# Patient Record
Sex: Male | Born: 1985 | Race: Black or African American | Hispanic: No | Marital: Single | Smoking: Current every day smoker
Health system: Southern US, Community
[De-identification: ages and names within clinical notes are randomized; demographics above are authoritative.]

## PROBLEM LIST (undated history)

## (undated) HISTORY — PX: OTHER SURGICAL HISTORY: SHX169

---

## 2008-11-26 ENCOUNTER — Emergency Department (HOSPITAL_COMMUNITY): Admission: EM | Admit: 2008-11-26 | Discharge: 2008-11-26 | Payer: Self-pay | Admitting: Emergency Medicine
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
## 2014-05-05 ENCOUNTER — Encounter (HOSPITAL_COMMUNITY): Payer: Self-pay | Admitting: Emergency Medicine

## 2014-05-05 ENCOUNTER — Emergency Department (HOSPITAL_COMMUNITY)
Admission: EM | Admit: 2014-05-05 | Discharge: 2014-05-05 | Disposition: A | Discharge status: Home / Self Care | Payer: Self-pay | Attending: Emergency Medicine | Admitting: Emergency Medicine

## 2014-05-05 DIAGNOSIS — K088 Other specified disorders of teeth and supporting structures: Secondary | ICD-10-CM | POA: Insufficient documentation

## 2014-05-05 DIAGNOSIS — J020 Streptococcal pharyngitis: Secondary | ICD-10-CM | POA: Insufficient documentation

## 2014-05-05 DIAGNOSIS — H9202 Otalgia, left ear: Secondary | ICD-10-CM | POA: Insufficient documentation

## 2014-05-05 DIAGNOSIS — Z720 Tobacco use: Secondary | ICD-10-CM | POA: Insufficient documentation

## 2014-05-05 LAB — RAPID STREP SCREEN (MED CTR MEBANE ONLY): STREPTOCOCCUS, GROUP A SCREEN (DIRECT): POSITIVE — AB

## 2014-05-05 MED ORDER — ACETAMINOPHEN 500 MG PO TABS
1000.0000 mg | ORAL_TABLET | Freq: Once | ORAL | Status: AC
  Administered 2014-05-05: 1000 mg via ORAL
  Filled 2014-05-05: qty 2

## 2014-05-05 MED ORDER — NAPROXEN 500 MG PO TABS: 500.0000 mg | ORAL_TABLET | Freq: Two times a day (BID) | ORAL | Status: AC

## 2014-05-05 MED ORDER — PENICILLIN G BENZATHINE 1200000 UNIT/2ML IM SUSP
1.2000 10*6.[IU] | Freq: Once | INTRAMUSCULAR | Status: AC
  Administered 2014-05-05: 1.2 10*6.[IU] via INTRAMUSCULAR
  Filled 2014-05-05: qty 2

## 2014-05-05 NOTE — Discharge Instructions (Signed)
You have been treated for strep throat. Take naprosyn as needed for pain. Refer to attached documents for more information.
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
## 2014-05-05 NOTE — ED Notes (Signed)
Pt states he started two months ago with left upper dental pain. Started yesterday with sore throat, left ear pain and chills.
